# Patient Record
Sex: Male | Born: 1980
Health system: Southern US, Community
[De-identification: ages and names within clinical notes are randomized; demographics above are authoritative.]

---

## 2014-10-24 DIAGNOSIS — Y92410 Unspecified street and highway as the place of occurrence of the external cause: Secondary | ICD-10-CM | POA: Insufficient documentation

## 2014-10-24 DIAGNOSIS — S29002A Unspecified injury of muscle and tendon of back wall of thorax, initial encounter: Secondary | ICD-10-CM | POA: Insufficient documentation

## 2014-10-24 DIAGNOSIS — S199XXA Unspecified injury of neck, initial encounter: Secondary | ICD-10-CM | POA: Insufficient documentation

## 2014-10-24 DIAGNOSIS — Y9389 Activity, other specified: Secondary | ICD-10-CM | POA: Insufficient documentation

## 2014-10-24 DIAGNOSIS — Y998 Other external cause status: Secondary | ICD-10-CM | POA: Insufficient documentation
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
## 2014-10-25 ENCOUNTER — Emergency Department: Payer: No Typology Code available for payment source

## 2014-10-25 ENCOUNTER — Emergency Department
Admission: EM | Admit: 2014-10-25 | Discharge: 2014-10-25 | Disposition: A | Discharge status: Home / Self Care | Payer: No Typology Code available for payment source | Attending: Emergency Medicine | Admitting: Emergency Medicine

## 2014-10-25 DIAGNOSIS — M7918 Myalgia, other site: Secondary | ICD-10-CM

## 2014-10-25 MED ORDER — CYCLOBENZAPRINE HCL 10 MG PO TABS
5.0000 mg | ORAL_TABLET | Freq: Once | ORAL | Status: AC
  Administered 2014-10-25: 5 mg via ORAL
  Filled 2014-10-25: qty 1

## 2014-10-25 MED ORDER — CYCLOBENZAPRINE HCL 10 MG PO TABS: 10.0000 mg | ORAL_TABLET | Freq: Three times a day (TID) | ORAL | Status: AC | PRN

## 2014-10-25 NOTE — ED Notes (Signed)
Patient transported to X-ray 
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
## 2014-10-25 NOTE — ED Notes (Signed)
Pt returned to room  
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
## 2018-09-05 ENCOUNTER — Other Ambulatory Visit: Payer: Self-pay | Admitting: Family Medicine

## 2018-09-05 DIAGNOSIS — Z20822 Contact with and (suspected) exposure to covid-19: Secondary | ICD-10-CM

## 2018-09-12 LAB — NOVEL CORONAVIRUS, NAA: SARS-CoV-2, NAA: NOT DETECTED

## 2018-09-13 ENCOUNTER — Telehealth: Payer: Self-pay

## 2018-09-13 NOTE — Telephone Encounter (Signed)
Pt given negative covid 19 results. Pt verbalized understanding

## 2018-09-14 NOTE — Telephone Encounter (Signed)
Per pt. request, mailed COVID results to his residence.

## 2019-06-12 ENCOUNTER — Ambulatory Visit: Payer: Self-pay | Attending: Internal Medicine

## 2019-06-12 ENCOUNTER — Other Ambulatory Visit: Payer: Self-pay

## 2019-06-12 DIAGNOSIS — Z23 Encounter for immunization: Secondary | ICD-10-CM

## 2019-06-12 NOTE — Progress Notes (Signed)
   Covid-19 Vaccination Clinic  Name:  Dolan Xia    MRN: [id number] DOB: 02/05/1981  06/12/2019  Mr. Heslop was observed post Covid-19 immunization for 15 minutes without incident. He was provided with Vaccine Information Sheet and instruction to access the V-Safe system.   Mr. Episcopo was instructed to call 911 with any severe reactions post vaccine: Marland Kitchen Difficulty breathing  . Swelling of face and throat  . A fast heartbeat  . A bad rash all over body  . Dizziness and weakness   Immunizations Administered    Name Date Dose VIS Date Route   Pfizer COVID-19 Vaccine 06/12/2019  5:08 PM 0.3 mL 02/19/2019 Intramuscular   Manufacturer: [employer], [employer]   Lot: [id number]   NDC: [address]-1

## 2019-07-03 ENCOUNTER — Other Ambulatory Visit: Payer: Self-pay

## 2019-07-03 ENCOUNTER — Ambulatory Visit: Payer: Self-pay | Attending: Internal Medicine

## 2019-07-03 DIAGNOSIS — Z23 Encounter for immunization: Secondary | ICD-10-CM

## 2019-07-03 NOTE — Progress Notes (Signed)
   Covid-19 Vaccination Clinic  Name:  [name]    MRN: [id number] DOB: [birth year]  07/03/2019  Mr. Hardgrove was observed post Covid-19 immunization for 15 minutes without incident. He was provided with Vaccine Information Sheet and instruction to access the V-Safe system. Medical Interpreter used.  Mr. Covino was instructed to call 911 with any severe reactions post vaccine: Marland Kitchen Difficulty breathing  . Swelling of face and throat  . A fast heartbeat  . A bad rash all over body  . Dizziness and weakness   Immunizations Administered    Name Date Dose VIS Date Route   Pfizer COVID-19 Vaccine 07/03/2019  4:59 PM 0.3 mL 05/05/2018 Intramuscular   Manufacturer: [employer], [employer]   Lot: [id number]   NDC: [address]-5
